# Patient Record
Sex: Female | Born: 1960 | Race: White | Hispanic: No | Marital: Married | Smoking: Never smoker
Health system: Southern US, Community
[De-identification: ages and names within clinical notes are randomized; demographics above are authoritative.]

## PROBLEM LIST (undated history)

## (undated) DIAGNOSIS — I10 Essential (primary) hypertension: Secondary | ICD-10-CM

## (undated) DIAGNOSIS — D219 Benign neoplasm of connective and other soft tissue, unspecified: Secondary | ICD-10-CM

## (undated) DIAGNOSIS — N809 Endometriosis, unspecified: Secondary | ICD-10-CM

## (undated) DIAGNOSIS — E119 Type 2 diabetes mellitus without complications: Secondary | ICD-10-CM

## (undated) HISTORY — DX: Benign neoplasm of connective and other soft tissue, unspecified: D21.9

## (undated) HISTORY — DX: Essential (primary) hypertension: I10

## (undated) HISTORY — PX: ABDOMINAL HYSTERECTOMY: SHX81

## (undated) HISTORY — PX: BREAST BIOPSY: SHX20

## (undated) HISTORY — DX: Endometriosis, unspecified: N80.9

## (undated) HISTORY — DX: Type 2 diabetes mellitus without complications: E11.9

---

## 1986-04-29 HISTORY — PX: LAPAROSCOPIC ABDOMINAL EXPLORATION: SHX6249

## 1998-03-22 ENCOUNTER — Other Ambulatory Visit: Admission: RE | Admit: 1998-03-22 | Discharge: 1998-03-22 | Payer: Self-pay | Admitting: *Deleted

## 1999-04-04 ENCOUNTER — Other Ambulatory Visit: Admission: RE | Admit: 1999-04-04 | Discharge: 1999-04-04 | Payer: Self-pay | Admitting: *Deleted

## 2000-10-15 ENCOUNTER — Other Ambulatory Visit: Admission: RE | Admit: 2000-10-15 | Discharge: 2000-10-15 | Payer: Self-pay | Admitting: *Deleted

## 2002-02-11 ENCOUNTER — Other Ambulatory Visit: Admission: RE | Admit: 2002-02-11 | Discharge: 2002-02-11 | Payer: Self-pay | Admitting: *Deleted

## 2003-04-30 HISTORY — PX: TOTAL ABDOMINAL HYSTERECTOMY W/ BILATERAL SALPINGOOPHORECTOMY: SHX83

## 2003-10-11 ENCOUNTER — Inpatient Hospital Stay (HOSPITAL_COMMUNITY): Admission: RE | Admit: 2003-10-11 | Discharge: 2003-10-13 | Payer: Self-pay | Admitting: Obstetrics and Gynecology

## 2004-04-10 ENCOUNTER — Ambulatory Visit: Admission: RE | Admit: 2004-04-10 | Discharge: 2004-04-10 | Payer: Self-pay | Admitting: *Deleted

## 2005-01-22 ENCOUNTER — Ambulatory Visit: Admission: RE | Admit: 2005-01-22 | Discharge: 2005-01-22 | Payer: Self-pay | Admitting: Family Medicine

## 2005-05-10 ENCOUNTER — Other Ambulatory Visit: Admission: RE | Admit: 2005-05-10 | Discharge: 2005-05-10 | Payer: Self-pay | Admitting: Obstetrics and Gynecology

## 2005-05-27 ENCOUNTER — Ambulatory Visit (HOSPITAL_COMMUNITY): Admission: RE | Admit: 2005-05-27 | Discharge: 2005-05-27 | Payer: Self-pay | Admitting: Obstetrics and Gynecology

## 2010-09-19 ENCOUNTER — Encounter: Payer: 59 | Attending: Family Medicine | Admitting: Dietician

## 2010-09-19 DIAGNOSIS — Z713 Dietary counseling and surveillance: Secondary | ICD-10-CM | POA: Insufficient documentation

## 2010-09-19 DIAGNOSIS — E119 Type 2 diabetes mellitus without complications: Secondary | ICD-10-CM | POA: Insufficient documentation

## 2010-09-19 DIAGNOSIS — E669 Obesity, unspecified: Secondary | ICD-10-CM | POA: Insufficient documentation

## 2011-09-04 ENCOUNTER — Other Ambulatory Visit: Payer: Self-pay | Admitting: Obstetrics & Gynecology

## 2011-09-04 DIAGNOSIS — Z1231 Encounter for screening mammogram for malignant neoplasm of breast: Secondary | ICD-10-CM

## 2011-10-01 ENCOUNTER — Ambulatory Visit (HOSPITAL_COMMUNITY)
Admission: RE | Admit: 2011-10-01 | Discharge: 2011-10-01 | Disposition: A | Discharge status: Home / Self Care | Payer: 59 | Source: Ambulatory Visit | Attending: Obstetrics & Gynecology | Admitting: Obstetrics & Gynecology

## 2011-10-01 DIAGNOSIS — Z1231 Encounter for screening mammogram for malignant neoplasm of breast: Secondary | ICD-10-CM | POA: Insufficient documentation

## 2011-10-09 ENCOUNTER — Other Ambulatory Visit: Payer: Self-pay | Admitting: Obstetrics & Gynecology

## 2011-10-09 DIAGNOSIS — R928 Other abnormal and inconclusive findings on diagnostic imaging of breast: Secondary | ICD-10-CM

## 2011-10-18 ENCOUNTER — Ambulatory Visit
Admission: RE | Admit: 2011-10-18 | Discharge: 2011-10-18 | Disposition: A | Discharge status: Home / Self Care | Payer: 59 | Source: Ambulatory Visit | Attending: Obstetrics & Gynecology | Admitting: Obstetrics & Gynecology

## 2011-10-18 ENCOUNTER — Other Ambulatory Visit: Payer: Self-pay | Admitting: Obstetrics & Gynecology

## 2011-10-18 DIAGNOSIS — R928 Other abnormal and inconclusive findings on diagnostic imaging of breast: Secondary | ICD-10-CM

## 2012-03-16 ENCOUNTER — Other Ambulatory Visit: Payer: Self-pay | Admitting: Obstetrics & Gynecology

## 2012-03-16 DIAGNOSIS — N63 Unspecified lump in unspecified breast: Secondary | ICD-10-CM

## 2012-03-16 DIAGNOSIS — N6489 Other specified disorders of breast: Secondary | ICD-10-CM

## 2012-04-09 ENCOUNTER — Ambulatory Visit
Admission: RE | Admit: 2012-04-09 | Discharge: 2012-04-09 | Disposition: A | Discharge status: Home / Self Care | Payer: BC Managed Care – PPO | Source: Ambulatory Visit | Attending: Obstetrics & Gynecology | Admitting: Obstetrics & Gynecology

## 2012-04-09 DIAGNOSIS — N63 Unspecified lump in unspecified breast: Secondary | ICD-10-CM

## 2012-04-09 DIAGNOSIS — N6489 Other specified disorders of breast: Secondary | ICD-10-CM

## 2012-09-23 ENCOUNTER — Encounter: Payer: Self-pay | Admitting: Obstetrics & Gynecology

## 2012-09-24 ENCOUNTER — Encounter: Payer: Self-pay | Admitting: Obstetrics & Gynecology

## 2012-09-24 ENCOUNTER — Ambulatory Visit (INDEPENDENT_AMBULATORY_CARE_PROVIDER_SITE_OTHER): Payer: BC Managed Care – PPO | Admitting: Obstetrics & Gynecology

## 2012-09-24 VITALS — BP 116/80 | Ht 63.0 in | Wt 209.2 lb

## 2012-09-24 DIAGNOSIS — Z23 Encounter for immunization: Secondary | ICD-10-CM

## 2012-09-24 DIAGNOSIS — Z01419 Encounter for gynecological examination (general) (routine) without abnormal findings: Secondary | ICD-10-CM

## 2012-09-24 DIAGNOSIS — Z1211 Encounter for screening for malignant neoplasm of colon: Secondary | ICD-10-CM

## 2012-09-24 DIAGNOSIS — Z0000 Encounter for general adult medical examination without abnormal findings: Secondary | ICD-10-CM

## 2012-09-24 LAB — POCT URINALYSIS DIPSTICK: Nitrite, UA: NEGATIVE

## 2012-09-24 MED ORDER — CLOBETASOL PROPIONATE 0.05 % EX CREA: TOPICAL_CREAM | Freq: Two times a day (BID) | CUTANEOUS | Status: AC

## 2012-09-24 NOTE — Progress Notes (Signed)
Tdap given and patient tolerated well.  

## 2012-09-24 NOTE — Progress Notes (Signed)
52 y.o. [id number] MarriedCaucasianF here for annual exam.  No vaginal bleeding.  She did have blood work in 08/02/2022.  CBC showed mildly elevated WBC ct but she had a cold while she was there.  Was told to repeat in 3 months.  She didn't go back and do it.  Cholesterol and HbA1C was okay.  She doesn't know any actual numbers.    Lots of stress.  Mother in law, in [place], died in 08-02-22.  She and spouse did a lot of the care.  Father in law is an alcoholic.  He is now in a nursing home.    Pt DECLINES a colonoscopy.  Will do stool test like last year.    No LMP recorded. Patient has had a hysterectomy.          Sexually active: yes  The current method of family planning is status post hysterectomy.    Exercising: yes  walking Smoker:  no  Health Maintenance: Pap:  ? 2008 History of abnormal Pap:  no MMG:  10/07/11 MMG, 10/18/11 additional views, 04/09/12 6 month f/u-diag MMG in 6 months Colonoscopy:  None.  DECLINES. BMD:   none TDaP:  Unsure.  Will do today. Screening Labs: 3/14 with Dr. Doristine Locks at [facility] in [place], [place] today: 13.7, Urine today: negative   reports that she has never smoked. She has never used smokeless tobacco. She reports that she does not drink alcohol or use illicit drugs.  Past Medical History  Diagnosis Date  . Diabetes mellitus without complication   . Hypertension     borderline  . Endometriosis     hysterectomy 2005  . Fibroid     hysterectomy in 2005    Past Surgical History  Procedure Laterality Date  . Total abdominal hysterectomy w/ bilateral salpingoophorectomy  2005    endometriosis, fibroids  . Laparoscopic abdominal exploration  1988    endometriosis  . Abdominal hysterectomy      Current Outpatient Prescriptions  Medication Sig Dispense Refill  . lisinopril (PRINIVIL,ZESTRIL) 10 MG tablet Take 10 mg by mouth daily.      . metFORMIN (GLUCOPHAGE) 1000 MG tablet Take 1,000 mg by mouth 2 (two) times daily with a meal.       No current  facility-administered medications for this visit.    Family History  Problem Relation Age of Onset  . Congestive Heart Failure Mother   . Diabetes Mother   . Hypertension Mother   . Thyroid disease Mother   . Thyroid disease Sister   . Brain cancer Father     ROS:  Pertinent items are noted in HPI.  Otherwise, a comprehensive ROS was negative.  Exam:   BP 116/80  Ht 5\' 3"  (1.6 m)  Wt 209 lb 3.2 oz (94.892 kg)  BMI 37.07 kg/m2  Weight change: +10lbs Height:   Height: 5\' 3"  (160 cm)  Ht Readings from Last 3 Encounters:  09/24/12 5\' 3"  (1.6 m)    General appearance: alert, cooperative and appears stated age Head: Normocephalic, without obvious abnormality, atraumatic Neck: no adenopathy, supple, symmetrical, trachea midline and thyroid normal to inspection and palpation Lungs: clear to auscultation bilaterally Breasts: normal appearance, no masses or tenderness, pendulous Heart: regular rate and rhythm Abdomen: soft, non-tender; bowel sounds normal; no masses,  no organomegaly Extremities: extremities normal, atraumatic, no cyanosis or edema Skin: Skin color, texture, turgor normal. No rashes or lesions Lymph nodes: Cervical, supraclavicular, and axillary nodes normal. No abnormal inguinal nodes palpated Neurologic: Grossly  normal   Pelvic: External genitalia:  no lesions              Urethra:  normal appearing urethra with no masses, tenderness or lesions              Bartholins and Skenes: normal                 Vagina: normal appearing vagina with normal color and discharge, no lesions              Cervix: absent              Pap taken: no Bimanual Exam:  Uterus:  uterus absent              Adnexa: no mass, fullness, tenderness               Rectovaginal: Confirms               Anus:  normal sphincter tone, no lesions  A:  Well Woman with normal exam H/O TAH/BSO DM  P:   Mammogram yearly.  On recall for follow-up.  Has appt in June. No pap smear needed. TDap  today. Declines colonoscopy.  OC light given. Labs with PCP. return annually or prn  An After Visit Summary was printed and given to the patient.

## 2012-09-24 NOTE — Patient Instructions (Signed)

## 2012-09-25 ENCOUNTER — Telehealth: Payer: Self-pay | Admitting: *Deleted

## 2012-09-25 NOTE — Telephone Encounter (Signed)
Message copied by Alisa Graff on Fri Sep 25, 2012 11:16 AM ------      Message from: Jerene Bears      Created: Thu Sep 24, 2012 10:52 PM       This is pt who should have gotten oc light today.  Will need to call her and mail it to her.  She lives in [place].  Maybe she will be coming back to [place] soon.  Please call and find out. ------

## 2012-09-25 NOTE — Telephone Encounter (Signed)
LMTCB

## 2012-10-20 ENCOUNTER — Other Ambulatory Visit: Payer: Self-pay | Admitting: Obstetrics & Gynecology

## 2012-10-20 DIAGNOSIS — N63 Unspecified lump in unspecified breast: Secondary | ICD-10-CM

## 2012-10-20 DIAGNOSIS — N6489 Other specified disorders of breast: Secondary | ICD-10-CM

## 2012-11-02 ENCOUNTER — Ambulatory Visit
Admission: RE | Admit: 2012-11-02 | Discharge: 2012-11-02 | Disposition: A | Discharge status: Home / Self Care | Payer: BC Managed Care – PPO | Source: Ambulatory Visit | Attending: Obstetrics & Gynecology | Admitting: Obstetrics & Gynecology

## 2012-11-02 DIAGNOSIS — N63 Unspecified lump in unspecified breast: Secondary | ICD-10-CM

## 2012-11-02 DIAGNOSIS — N6489 Other specified disorders of breast: Secondary | ICD-10-CM

## 2012-11-04 NOTE — Telephone Encounter (Signed)
Kit was mailed to patient.

## 2012-12-30 NOTE — Addendum Note (Signed)
Addended by: Alphonsa Overall on: 12/30/2012 10:51 AM   Modules accepted: Orders

## 2012-12-31 ENCOUNTER — Telehealth: Payer: Self-pay

## 2012-12-31 NOTE — Telephone Encounter (Signed)
9/4 lmtcb

## 2012-12-31 NOTE — Telephone Encounter (Signed)
Message copied by Elisha Headland on Thu Dec 31, 2012  9:07 AM ------      Message from: Jerene Bears      Created: Thu Dec 31, 2012  2:57 AM       Is pt aware? If no, please inform. ------

## 2013-01-12 ENCOUNTER — Telehealth: Payer: Self-pay

## 2013-01-15 NOTE — Telephone Encounter (Signed)
Patient notified of IFOB results.

## 2013-01-15 NOTE — Telephone Encounter (Signed)
Patient aware of IFOB results.

## 2013-05-26 ENCOUNTER — Encounter: Payer: Self-pay | Admitting: Obstetrics & Gynecology

## 2013-10-15 ENCOUNTER — Ambulatory Visit: Payer: BC Managed Care – PPO | Admitting: Obstetrics & Gynecology

## 2013-10-18 ENCOUNTER — Encounter: Payer: Self-pay | Admitting: Obstetrics & Gynecology

## 2013-10-18 ENCOUNTER — Other Ambulatory Visit: Payer: Self-pay | Admitting: Obstetrics & Gynecology

## 2013-10-18 ENCOUNTER — Telehealth: Payer: Self-pay | Admitting: Obstetrics & Gynecology

## 2013-10-18 ENCOUNTER — Ambulatory Visit (INDEPENDENT_AMBULATORY_CARE_PROVIDER_SITE_OTHER): Payer: BC Managed Care – PPO | Admitting: Obstetrics & Gynecology

## 2013-10-18 VITALS — BP 142/80 | HR 64 | Resp 20 | Ht 62.5 in | Wt 234.8 lb

## 2013-10-18 DIAGNOSIS — E119 Type 2 diabetes mellitus without complications: Secondary | ICD-10-CM

## 2013-10-18 DIAGNOSIS — Z0000 Encounter for general adult medical examination without abnormal findings: Secondary | ICD-10-CM

## 2013-10-18 DIAGNOSIS — Z1211 Encounter for screening for malignant neoplasm of colon: Secondary | ICD-10-CM

## 2013-10-18 DIAGNOSIS — Z01419 Encounter for gynecological examination (general) (routine) without abnormal findings: Secondary | ICD-10-CM

## 2013-10-18 DIAGNOSIS — N6489 Other specified disorders of breast: Secondary | ICD-10-CM

## 2013-10-18 LAB — COMPREHENSIVE METABOLIC PANEL
ALK PHOS: 90 U/L (ref 39–117)
ALT: 14 U/L (ref 0–35)
AST: 14 U/L (ref 0–37)
Albumin: 4.5 g/dL (ref 3.5–5.2)
BUN: 12 mg/dL (ref 6–23)
CO2: 26 meq/L (ref 19–32)
Calcium: 9.5 mg/dL (ref 8.4–10.5)
Chloride: 102 mEq/L (ref 96–112)
Creat: 0.71 mg/dL (ref 0.50–1.10)
Glucose, Bld: 99 mg/dL (ref 70–99)
Potassium: 3.9 mEq/L (ref 3.5–5.3)
SODIUM: 138 meq/L (ref 135–145)
Total Bilirubin: 0.5 mg/dL (ref 0.2–1.2)
Total Protein: 7.3 g/dL (ref 6.0–8.3)

## 2013-10-18 LAB — CBC
HCT: 38.1 % (ref 36.0–46.0)
Hemoglobin: 12.8 g/dL (ref 12.0–15.0)
MCH: 26.3 pg (ref 26.0–34.0)
MCHC: 33.6 g/dL (ref 30.0–36.0)
MCV: 78.4 fL (ref 78.0–100.0)
Platelets: 337 10*3/uL (ref 150–400)
RBC: 4.86 MIL/uL (ref 3.87–5.11)
RDW: 15.2 % (ref 11.5–15.5)
WBC: 7.5 10*3/uL (ref 4.0–10.5)

## 2013-10-18 LAB — LIPID PANEL
CHOL/HDL RATIO: 5 ratio
CHOLESTEROL: 210 mg/dL — AB (ref 0–200)
HDL: 42 mg/dL (ref >39)
LDL Cholesterol: 130 mg/dL — ABNORMAL HIGH (ref 0–99)
TRIGLYCERIDES: 191 mg/dL — AB (ref <150)
VLDL: 38 mg/dL (ref 0–40)

## 2013-10-18 LAB — HEMOGLOBIN A1C
Hgb A1c MFr Bld: 7.7 % — ABNORMAL HIGH (ref <5.7)
MEAN PLASMA GLUCOSE: 174 mg/dL — AB (ref <117)

## 2013-10-18 LAB — POCT URINALYSIS DIPSTICK
BILIRUBIN UA: NEGATIVE
Glucose, UA: NEGATIVE
Ketones, UA: NEGATIVE
Leukocytes, UA: NEGATIVE
Nitrite, UA: NEGATIVE
PH UA: 5
PROTEIN UA: NEGATIVE
RBC UA: NEGATIVE
UROBILINOGEN UA: NEGATIVE

## 2013-10-18 LAB — TSH: TSH: 1.939 u[IU]/mL (ref 0.350–4.500)

## 2013-10-18 NOTE — Telephone Encounter (Signed)
Voicemail confirmed mobile #. Left message stating that mammogram is scheduled 07.08.2015 @ 0930 at the [facility]. Advised patient to arrive at 0915.

## 2013-10-18 NOTE — Progress Notes (Signed)
53 y.o. [id number] MarriedCaucasianF here for annual exam.  Doing well.  No vaginal bleeding.  Knows she needs to schedule MMG.  Will be doing a diagnostic.  She would like [place] to schedule.  Reports she has stopped all of her medicine--the metformin and the lisonopril.  Brother in law with bladder cancer that was "caused" by metformin.  He is doing OK but pt doesn't really want to be on any medications if possible.    Patient's last menstrual period was 04/30/2003.          Sexually active: yes  The current method of family planning is status post hysterectomy.    Exercising: yes  walking daily Smoker:  no  Health Maintenance: Pap:  ?2008 History of abnormal Pap:  no MMG:  11/02/12-bilateral diag one year Colonoscopy:  none BMD:   none TDaP:  09/24/12 Screening Labs: today, Hb today: today, Urine today: negative   reports that she has never smoked. She has never used smokeless tobacco. She reports that she does not drink alcohol or use illicit drugs.  Past Medical History  Diagnosis Date  . Diabetes mellitus without complication   . Hypertension     borderline  . Endometriosis     hysterectomy 2005  . Fibroid     hysterectomy in 2005    Past Surgical History  Procedure Laterality Date  . Total abdominal hysterectomy w/ bilateral salpingoophorectomy  2005    endometriosis, fibroids  . Laparoscopic abdominal exploration  1988    endometriosis  . Abdominal hysterectomy      Current Outpatient Prescriptions  Medication Sig Dispense Refill  . clobetasol cream (TEMOVATE) 0.05 % Apply topically 2 (two) times daily.  60 g  1  . lisinopril (PRINIVIL,ZESTRIL) 10 MG tablet Take 10 mg by mouth daily.      . metFORMIN (GLUCOPHAGE) 1000 MG tablet Take 1,000 mg by mouth 2 (two) times daily with a meal.       No current facility-administered medications for this visit.    Family History  Problem Relation Age of Onset  . Congestive Heart Failure Mother   . Diabetes Mother   . Hypertension  Mother   . Thyroid disease Mother   . Thyroid disease Sister   . Brain cancer Father     ROS:  Pertinent items are noted in HPI.  Otherwise, a comprehensive ROS was negative.  Exam:   BP 142/80  Pulse 64  Resp 20  Ht 5' 2.5" (1.588 m)  Wt 234 lb 12.8 oz (106.505 kg)  BMI 42.23 kg/m2  LMP 04/30/2003  Weight change: +16#  Height: 5' 2.5" (158.8 cm)  Ht Readings from Last 3 Encounters:  10/18/13 5' 2.5" (1.588 m)  09/24/12 5\' 3"  (1.6 m)    General appearance: alert, cooperative and appears stated age Head: Normocephalic, without obvious abnormality, atraumatic Neck: no adenopathy, supple, symmetrical, trachea midline and thyroid normal to inspection and palpation Lungs: clear to auscultation bilaterally Breasts: normal appearance, no masses or tenderness Heart: regular rate and rhythm Abdomen: soft, non-tender; bowel sounds normal; no masses,  no organomegaly Extremities: extremities normal, atraumatic, no cyanosis or edema Skin: Skin color, texture, turgor normal. No rashes or lesions Lymph nodes: Cervical, supraclavicular, and axillary nodes normal. No abnormal inguinal nodes palpated Neurologic: Grossly normal   Pelvic: External genitalia:  no lesions              Urethra:  normal appearing urethra with no masses, tenderness or lesions  Bartholins and Skenes: normal                 Vagina: normal appearing vagina with normal color and discharge, no lesions              Cervix: absent              Pap taken: no Bimanual Exam:  Uterus:  uterus absent              Adnexa: normal adnexa and no mass, fullness, tenderness               Rectovaginal: Confirms               Anus:  normal sphincter tone, no lesions  A:  Well Woman with normal exam  H/O TAH/BSO  DM.  Stopped medications and hasn't follow up with PCP.  P: Mammogram yearly. Follow due in July.  Hasn't scheduled.   No pap smear needed.  Declines colonoscopy. OC light given.  CBC, CMP, Lipids, TSH,  and Vit D, hb A1c. Labs with PCP.  An After Visit Summary was printed and given to the patient.

## 2013-10-19 LAB — VITAMIN D 25 HYDROXY (VIT D DEFICIENCY, FRACTURES): VIT D 25 HYDROXY: 52 ng/mL (ref 30–89)

## 2013-10-19 LAB — HEMOGLOBIN, FINGERSTICK: HEMOGLOBIN, FINGERSTICK: 12.6 g/dL (ref 12.0–16.0)

## 2013-10-22 ENCOUNTER — Telehealth: Payer: Self-pay

## 2013-10-22 NOTE — Telephone Encounter (Signed)
Message copied by Robley Fries on Fri Oct 22, 2013  9:48 AM ------      Message from: Megan Salon      Created: Wed Oct 20, 2013 10:12 AM       Please inform pt CMP is normal.  TSH is normal.  CBC is normal.  Vit D is normal.  Hb A1C is 7.7.  Her diabetes is NOT under control.  Also, her cholesterol is elevated--TGs are elevated, LDLs are elevated, HDLs are low.  Ratio is high.  I highly recommend she consider treatment for both.  Would she like to go back to her PCP? ------

## 2013-10-22 NOTE — Telephone Encounter (Signed)
Lmtcb//kn 

## 2013-10-25 NOTE — Telephone Encounter (Signed)
Pt returning call

## 2013-10-26 NOTE — Telephone Encounter (Signed)
Patient notified of all results. See phone note//kn

## 2013-11-03 ENCOUNTER — Ambulatory Visit
Admission: RE | Admit: 2013-11-03 | Discharge: 2013-11-03 | Disposition: A | Discharge status: Home / Self Care | Payer: BC Managed Care – PPO | Source: Ambulatory Visit | Attending: Obstetrics & Gynecology | Admitting: Obstetrics & Gynecology

## 2013-11-03 DIAGNOSIS — N6489 Other specified disorders of breast: Secondary | ICD-10-CM

## 2013-11-03 LAB — FECAL OCCULT BLOOD, IMMUNOCHEMICAL: IFOBT: NEGATIVE

## 2014-02-28 ENCOUNTER — Encounter: Payer: Self-pay | Admitting: Obstetrics & Gynecology

## 2014-09-27 ENCOUNTER — Other Ambulatory Visit: Payer: Self-pay

## 2014-09-27 DIAGNOSIS — Z1231 Encounter for screening mammogram for malignant neoplasm of breast: Secondary | ICD-10-CM

## 2014-11-11 ENCOUNTER — Ambulatory Visit (INDEPENDENT_AMBULATORY_CARE_PROVIDER_SITE_OTHER): Payer: BLUE CROSS/BLUE SHIELD | Admitting: Obstetrics & Gynecology

## 2014-11-11 ENCOUNTER — Encounter: Payer: Self-pay | Admitting: Obstetrics & Gynecology

## 2014-11-11 VITALS — BP 122/68 | HR 64 | Resp 20 | Ht 63.0 in | Wt 221.0 lb

## 2014-11-11 DIAGNOSIS — Z01419 Encounter for gynecological examination (general) (routine) without abnormal findings: Secondary | ICD-10-CM

## 2014-11-11 DIAGNOSIS — I10 Essential (primary) hypertension: Secondary | ICD-10-CM | POA: Diagnosis not present

## 2014-11-11 DIAGNOSIS — E785 Hyperlipidemia, unspecified: Secondary | ICD-10-CM

## 2014-11-11 DIAGNOSIS — Z1211 Encounter for screening for malignant neoplasm of colon: Secondary | ICD-10-CM | POA: Diagnosis not present

## 2014-11-11 DIAGNOSIS — Z0000 Encounter for general adult medical examination without abnormal findings: Secondary | ICD-10-CM

## 2014-11-11 DIAGNOSIS — E119 Type 2 diabetes mellitus without complications: Secondary | ICD-10-CM | POA: Insufficient documentation

## 2014-11-11 LAB — POCT URINALYSIS DIPSTICK
BILIRUBIN UA: NEGATIVE
Blood, UA: NEGATIVE
GLUCOSE UA: NEGATIVE
KETONES UA: NEGATIVE
Leukocytes, UA: NEGATIVE
Nitrite, UA: NEGATIVE
Protein, UA: NEGATIVE
Urobilinogen, UA: NEGATIVE
pH, UA: 7

## 2014-11-11 NOTE — Addendum Note (Signed)
Addended by: Megan Salon on: 11/11/2014 10:03 AM   Modules accepted: Miquel Dunn

## 2014-11-11 NOTE — Progress Notes (Signed)
54 y.o. [id number] MarriedCaucasianF here for annual exam.  Pt changing jobs.  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  Very glad job will be changing.  She is happy to be changing jobs.    Otherwise, doing well.   Last hba1c was 6.2.  Was 7.7 one year ago.    Denies VB.  PCP:  Dr. Tollie Pizza  Patient's last menstrual period was 04/30/2003.          Sexually active: Yes.    The current method of family planning is status post hysterectomy.    Exercising: Yes.    walking Smoker:  no  Health Maintenance: Pap:  ? 2008 History of abnormal Pap:  no MMG:  11/03/13 Diag-BiRads 1, normal-screening in one year Colonoscopy:  none BMD:   none TDaP:  09/24/12 Screening Labs: PCP, Hb today: PCP, Urine today: PH-7.0   reports that she has never smoked. She has never used smokeless tobacco. She reports that she does not drink alcohol or use illicit drugs.  Past Medical History  Diagnosis Date  . Diabetes mellitus without complication   . Hypertension     borderline  . Endometriosis     hysterectomy 2005  . Fibroid     hysterectomy in 2005    Past Surgical History  Procedure Laterality Date  . Total abdominal hysterectomy w/ bilateral salpingoophorectomy  2005    endometriosis, fibroids  . Laparoscopic abdominal exploration  1988    endometriosis  . Abdominal hysterectomy      Current Outpatient Prescriptions  Medication Sig Dispense Refill  . atorvastatin (LIPITOR) 20 MG tablet Take 20 mg by mouth every evening. 1/2 tablet 3 x weekly  4  . lisinopril (PRINIVIL,ZESTRIL) 10 MG tablet Take 10 mg by mouth daily.    . metFORMIN (GLUCOPHAGE) 1000 MG tablet Take 1,000 mg by mouth 2 (two) times daily with a meal.    . clobetasol cream (TEMOVATE) 0.05 % Apply topically 2 (two) times daily. (Patient not taking: Reported on 11/11/2014) 60 g 1   No current facility-administered medications for this visit.    Family History  Problem Relation Age of Onset  . Congestive Heart Failure  Mother   . Diabetes Mother   . Hypertension Mother   . Thyroid disease Mother   . Thyroid disease Sister   . Brain cancer Father     ROS:  Pertinent items are noted in HPI.  Otherwise, a comprehensive ROS was negative.  Exam:   BP 122/68 mmHg  Pulse 64  Resp 20  Ht 5\' 3"  (1.6 m)  Wt 221 lb (100.245 kg)  BMI 39.16 kg/m2  LMP 04/30/2003  Weight change: -13#    Height: 5\' 3"  (160 cm)  Ht Readings from Last 3 Encounters:  11/11/14 5\' 3"  (1.6 m)  10/18/13 5' 2.5" (1.588 m)  09/24/12 5\' 3"  (1.6 m)    General appearance: alert, cooperative and appears stated age Head: Normocephalic, without obvious abnormality, atraumatic Neck: no adenopathy, supple, symmetrical, trachea midline and thyroid normal to inspection and palpation Lungs: clear to auscultation bilaterally Breasts: normal appearance, no masses or tenderness Heart: regular rate and rhythm Abdomen: soft, non-tender; bowel sounds normal; no masses,  no organomegaly Extremities: extremities normal, atraumatic, no cyanosis or edema Skin: Skin color, texture, turgor normal. No rashes or lesions Lymph nodes: Cervical, supraclavicular, and axillary nodes normal. No abnormal inguinal nodes palpated Neurologic: Grossly normal   Pelvic: External genitalia:  no lesions  Urethra:  normal appearing urethra with no masses, tenderness or lesions              Bartholins and Skenes: normal                 Vagina: normal appearing vagina with normal color and discharge, no lesions              Cervix: absent              Pap taken: No. Bimanual Exam:  Uterus:  uterus absent              Adnexa: normal adnexa and no mass, fullness, tenderness               Rectovaginal: Confirms               Anus:  normal sphincter tone, no lesions  Chaperone was present for exam.  A:  Well Woman with normal exam  H/O TAH/BSO  DM. Stopped medications and hasn't follow up with PCP.  P: Mammogram yearly.  Pt has scheduled. No pap  smear needed.  Declines colonoscopy. OC light given.  Labs with Dr. Tollie Pizza.

## 2014-11-15 ENCOUNTER — Ambulatory Visit
Admission: RE | Admit: 2014-11-15 | Discharge: 2014-11-15 | Disposition: A | Discharge status: Home / Self Care | Payer: BLUE CROSS/BLUE SHIELD | Source: Ambulatory Visit

## 2014-11-15 DIAGNOSIS — Z1231 Encounter for screening mammogram for malignant neoplasm of breast: Secondary | ICD-10-CM

## 2014-12-05 LAB — FECAL OCCULT BLOOD, IMMUNOCHEMICAL: IMMUNOLOGICAL FECAL OCCULT BLOOD TEST: NEGATIVE

## 2014-12-05 NOTE — Addendum Note (Signed)
Addended by: Susanne Greenhouse E on: 12/05/2014 10:08 AM   Modules accepted: Orders

## 2014-12-06 ENCOUNTER — Telehealth: Payer: Self-pay

## 2014-12-06 NOTE — Telephone Encounter (Signed)
-----   Message from Megan Salon, MD sent at 12/05/2014 12:28 PM EDT ----- Inform stool test negative for blood.

## 2014-12-06 NOTE — Telephone Encounter (Signed)
Lmtcb//kn 

## 2014-12-15 NOTE — Telephone Encounter (Signed)
-----   Message from Megan Salon, MD sent at 12/05/2014 12:28 PM EDT ----- Inform stool test negative for blood.

## 2014-12-15 NOTE — Telephone Encounter (Signed)
Left Message To Call Back  

## 2015-01-31 NOTE — Telephone Encounter (Signed)
Lmtcb//kn 

## 2015-02-02 ENCOUNTER — Telehealth: Payer: Self-pay | Admitting: Obstetrics & Gynecology

## 2015-02-02 NOTE — Telephone Encounter (Signed)
Patient notified of all results.//kn 

## 2015-02-02 NOTE — Telephone Encounter (Signed)
Patient notified of results.//kn 

## 2015-02-02 NOTE — Telephone Encounter (Signed)
Patient returned [facility] call.

## 2015-10-30 ENCOUNTER — Other Ambulatory Visit: Payer: Self-pay | Admitting: Family Medicine

## 2015-10-30 DIAGNOSIS — Z1231 Encounter for screening mammogram for malignant neoplasm of breast: Secondary | ICD-10-CM

## 2015-11-16 ENCOUNTER — Ambulatory Visit
Admission: RE | Admit: 2015-11-16 | Discharge: 2015-11-16 | Disposition: A | Discharge status: Home / Self Care | Payer: BLUE CROSS/BLUE SHIELD | Source: Ambulatory Visit | Attending: Family Medicine | Admitting: Family Medicine

## 2015-11-16 DIAGNOSIS — Z1231 Encounter for screening mammogram for malignant neoplasm of breast: Secondary | ICD-10-CM

## 2016-01-26 ENCOUNTER — Telehealth: Payer: Self-pay | Admitting: Obstetrics & Gynecology

## 2016-01-26 NOTE — Telephone Encounter (Signed)
Left patient a message to call back to reschedule a future appointment that was cancelled by the provider. °

## 2016-02-09 ENCOUNTER — Ambulatory Visit: Payer: BLUE CROSS/BLUE SHIELD | Admitting: Obstetrics & Gynecology

## 2016-11-08 ENCOUNTER — Other Ambulatory Visit: Payer: Self-pay | Admitting: Obstetrics & Gynecology

## 2016-11-08 DIAGNOSIS — Z1231 Encounter for screening mammogram for malignant neoplasm of breast: Secondary | ICD-10-CM

## 2016-11-18 ENCOUNTER — Ambulatory Visit
Admission: RE | Admit: 2016-11-18 | Discharge: 2016-11-18 | Disposition: A | Discharge status: Home / Self Care | Payer: BLUE CROSS/BLUE SHIELD | Source: Ambulatory Visit | Attending: Obstetrics & Gynecology | Admitting: Obstetrics & Gynecology

## 2016-11-18 DIAGNOSIS — Z1231 Encounter for screening mammogram for malignant neoplasm of breast: Secondary | ICD-10-CM

## 2017-11-12 ENCOUNTER — Other Ambulatory Visit: Payer: Self-pay | Admitting: Physician Assistant

## 2017-11-12 DIAGNOSIS — Z1231 Encounter for screening mammogram for malignant neoplasm of breast: Secondary | ICD-10-CM

## 2017-12-02 ENCOUNTER — Ambulatory Visit
Admission: RE | Admit: 2017-12-02 | Discharge: 2017-12-02 | Disposition: A | Discharge status: Home / Self Care | Payer: 59 | Source: Ambulatory Visit | Attending: Physician Assistant | Admitting: Physician Assistant

## 2017-12-02 DIAGNOSIS — Z1231 Encounter for screening mammogram for malignant neoplasm of breast: Secondary | ICD-10-CM

## 2018-11-27 ENCOUNTER — Other Ambulatory Visit: Payer: Self-pay | Admitting: Physician Assistant

## 2018-11-27 DIAGNOSIS — Z1231 Encounter for screening mammogram for malignant neoplasm of breast: Secondary | ICD-10-CM

## 2019-01-11 ENCOUNTER — Ambulatory Visit
Admission: RE | Admit: 2019-01-11 | Discharge: 2019-01-11 | Disposition: A | Discharge status: Home / Self Care | Payer: 59 | Source: Ambulatory Visit | Attending: Physician Assistant | Admitting: Physician Assistant

## 2019-01-11 ENCOUNTER — Other Ambulatory Visit: Payer: Self-pay

## 2019-01-11 DIAGNOSIS — Z1231 Encounter for screening mammogram for malignant neoplasm of breast: Secondary | ICD-10-CM

## 2020-01-25 ENCOUNTER — Other Ambulatory Visit: Payer: Self-pay | Admitting: Physician Assistant

## 2020-01-25 DIAGNOSIS — Z1231 Encounter for screening mammogram for malignant neoplasm of breast: Secondary | ICD-10-CM

## 2020-02-01 ENCOUNTER — Other Ambulatory Visit: Payer: Self-pay

## 2020-02-01 ENCOUNTER — Ambulatory Visit
Admission: RE | Admit: 2020-02-01 | Discharge: 2020-02-01 | Disposition: A | Discharge status: Home / Self Care | Payer: 59 | Source: Ambulatory Visit | Attending: Physician Assistant | Admitting: Physician Assistant

## 2020-02-01 DIAGNOSIS — Z1231 Encounter for screening mammogram for malignant neoplasm of breast: Secondary | ICD-10-CM

## 2021-02-14 ENCOUNTER — Other Ambulatory Visit: Payer: Self-pay | Admitting: Physician Assistant

## 2021-02-14 DIAGNOSIS — Z1231 Encounter for screening mammogram for malignant neoplasm of breast: Secondary | ICD-10-CM

## 2021-03-14 ENCOUNTER — Ambulatory Visit
Admission: RE | Admit: 2021-03-14 | Discharge: 2021-03-14 | Disposition: A | Discharge status: Home / Self Care | Payer: 59 | Source: Ambulatory Visit | Attending: Physician Assistant | Admitting: Physician Assistant

## 2021-03-14 ENCOUNTER — Other Ambulatory Visit: Payer: Self-pay

## 2021-03-14 DIAGNOSIS — Z1231 Encounter for screening mammogram for malignant neoplasm of breast: Secondary | ICD-10-CM

## 2022-03-14 ENCOUNTER — Other Ambulatory Visit: Payer: Self-pay

## 2022-03-14 DIAGNOSIS — Z1231 Encounter for screening mammogram for malignant neoplasm of breast: Secondary | ICD-10-CM

## 2022-05-09 ENCOUNTER — Ambulatory Visit
Admission: RE | Admit: 2022-05-09 | Discharge: 2022-05-09 | Disposition: A | Discharge status: Home / Self Care | Payer: 59 | Source: Ambulatory Visit | Attending: Physician Assistant | Admitting: Physician Assistant

## 2022-05-09 DIAGNOSIS — Z1231 Encounter for screening mammogram for malignant neoplasm of breast: Secondary | ICD-10-CM

## 2022-12-29 IMAGING — MG MM DIGITAL SCREENING BILAT W/ TOMO AND CAD
8 of 14 series · 8 of 40 positions shown · non-contrast
Comparison: Previous exam(s).

CLINICAL DATA: Screening.

EXAM:
DIGITAL SCREENING BILATERAL MAMMOGRAM WITH TOMOSYNTHESIS AND CAD
TECHNIQUE: Bilateral screening digital craniocaudal and mediolateral oblique
mammograms were obtained. Bilateral screening digital breast
tomosynthesis was performed. The images were evaluated with
computer-aided detection.

[L MLO synth-2D (1 of 2)]
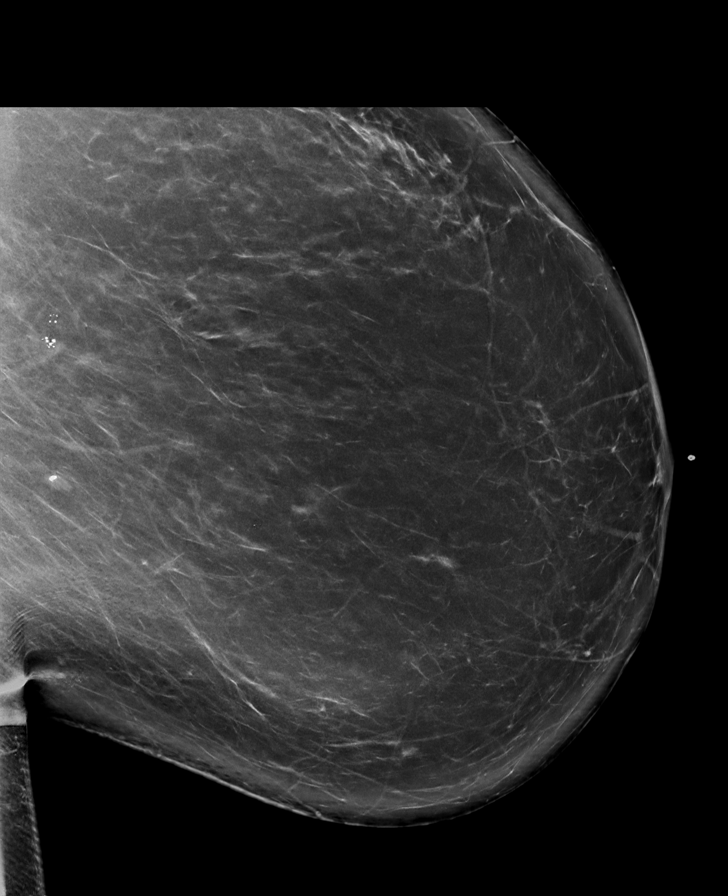

[L MLO synth-2D (2 of 2)]
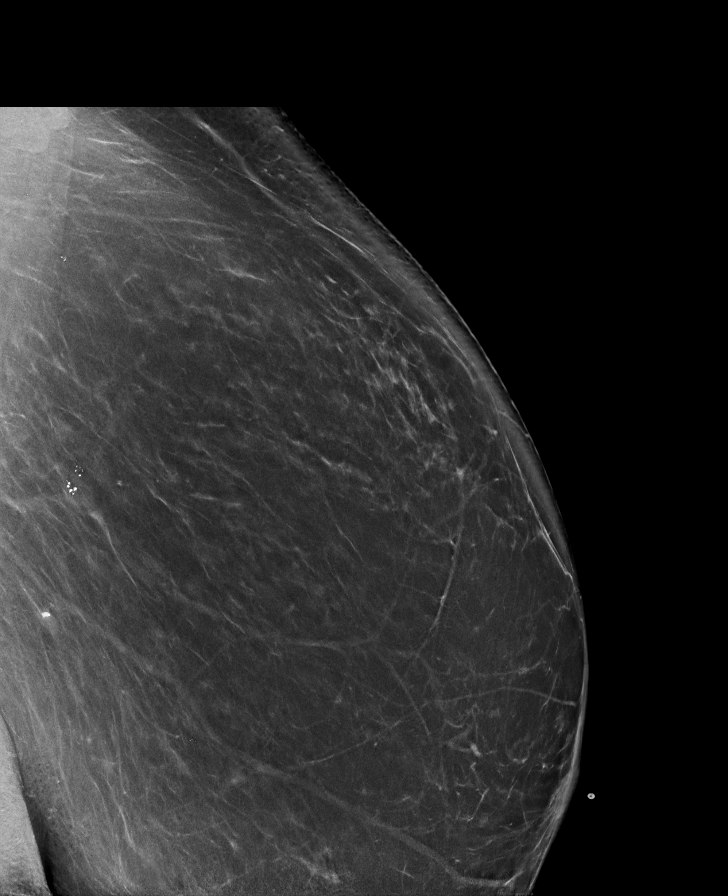

[R XCCL synth-2D]
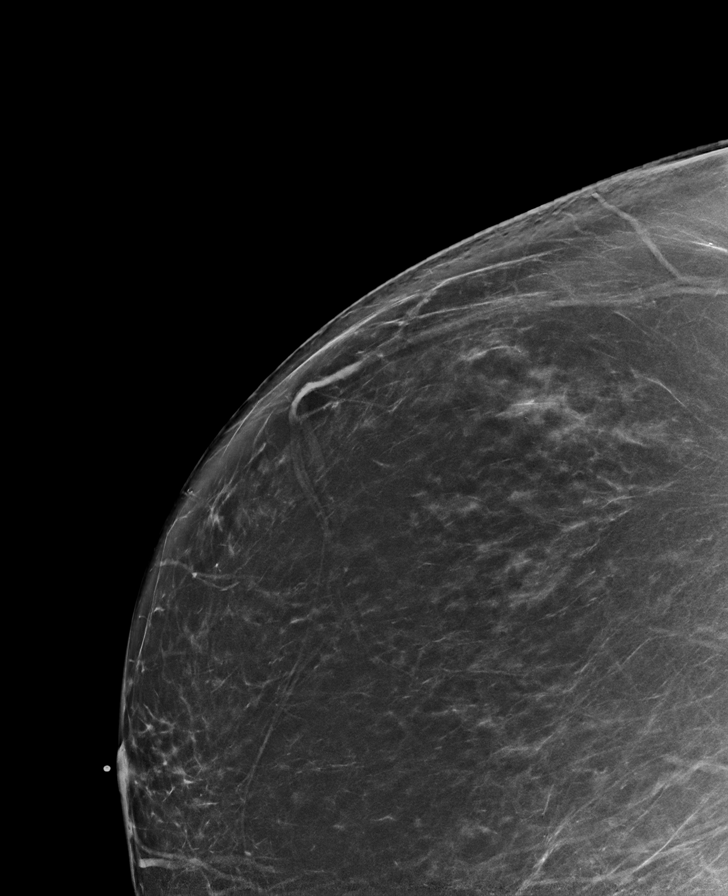

[L XCCL synth-2D]
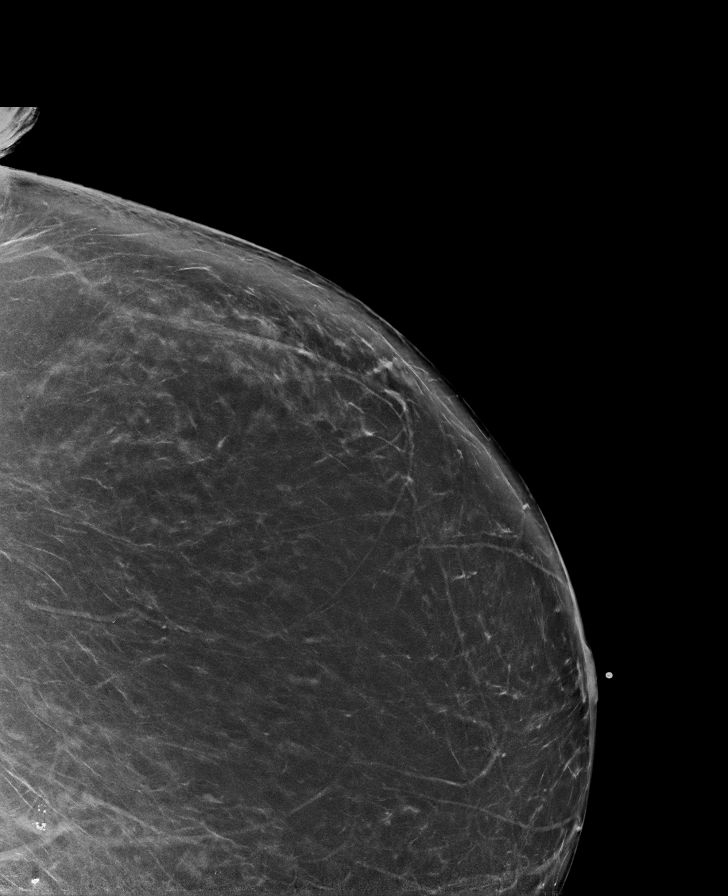

[R MLO synth-2D (1 of 2)]
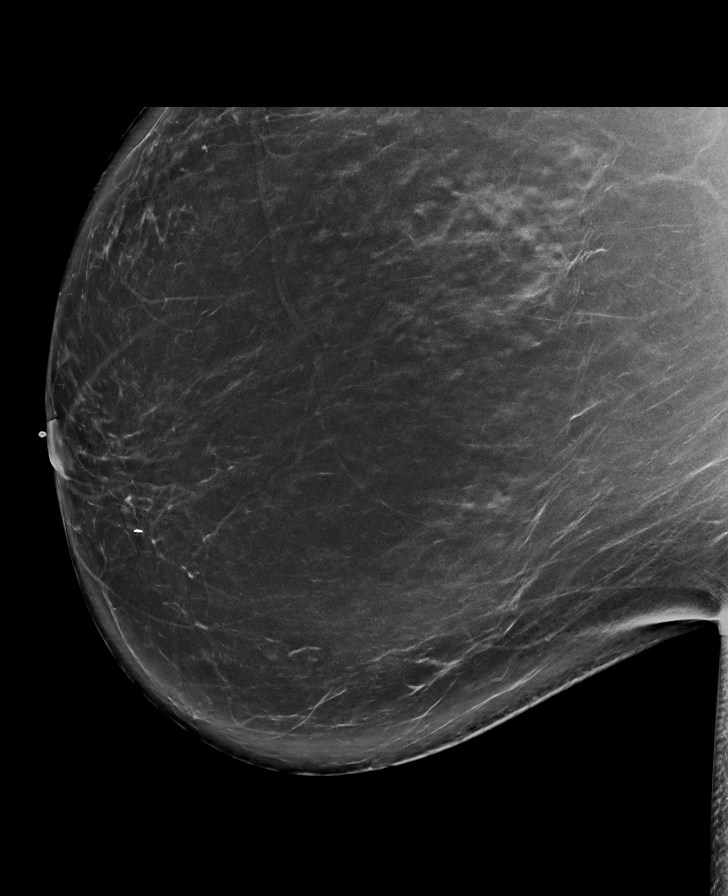

[L CC synth-2D]
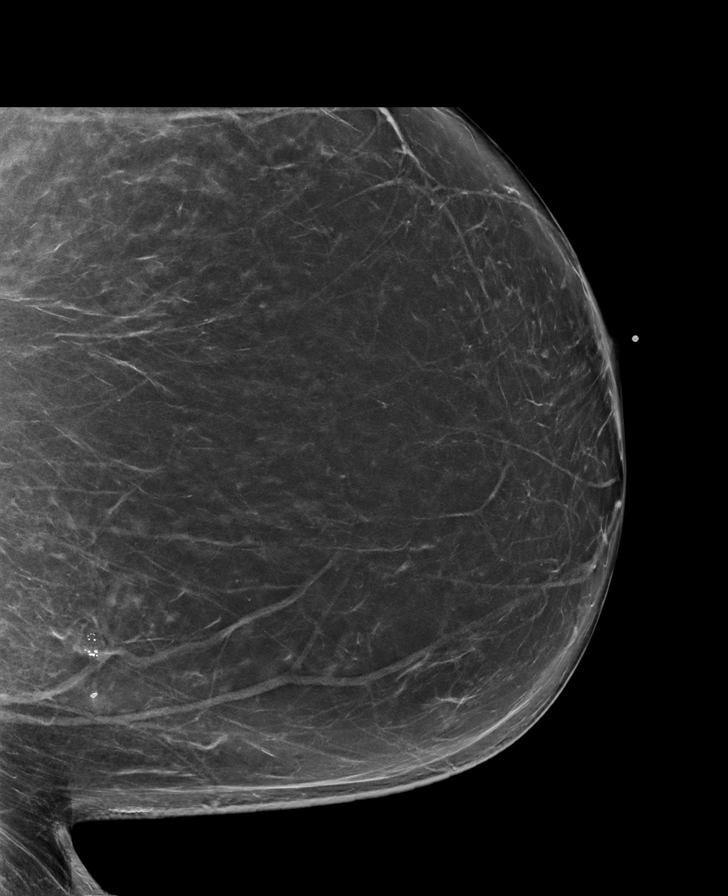

[R MLO synth-2D (2 of 2)]
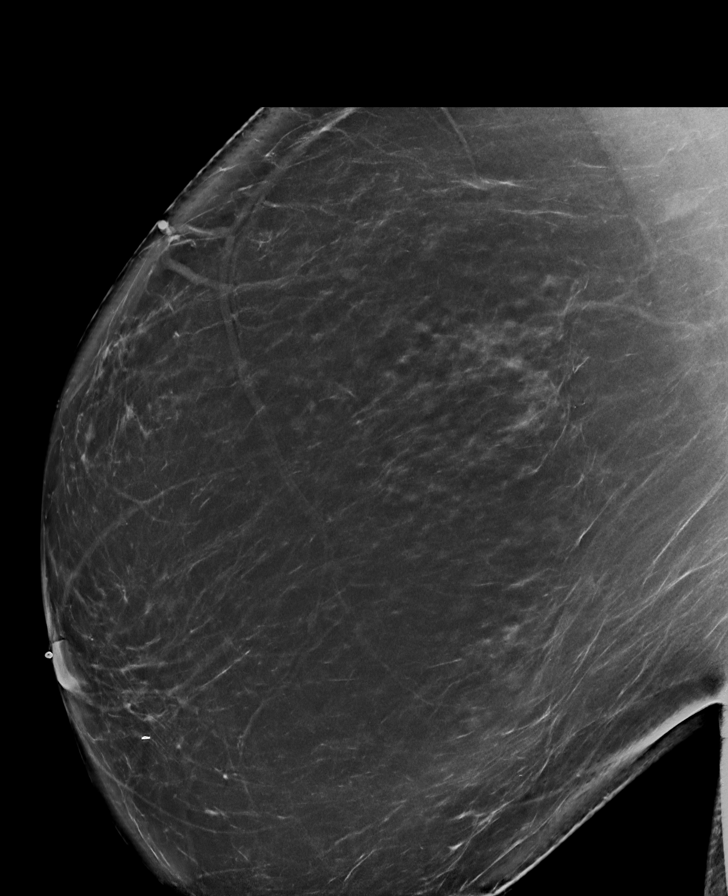

[R XCCL tomo · tomo slice 49/98.0]
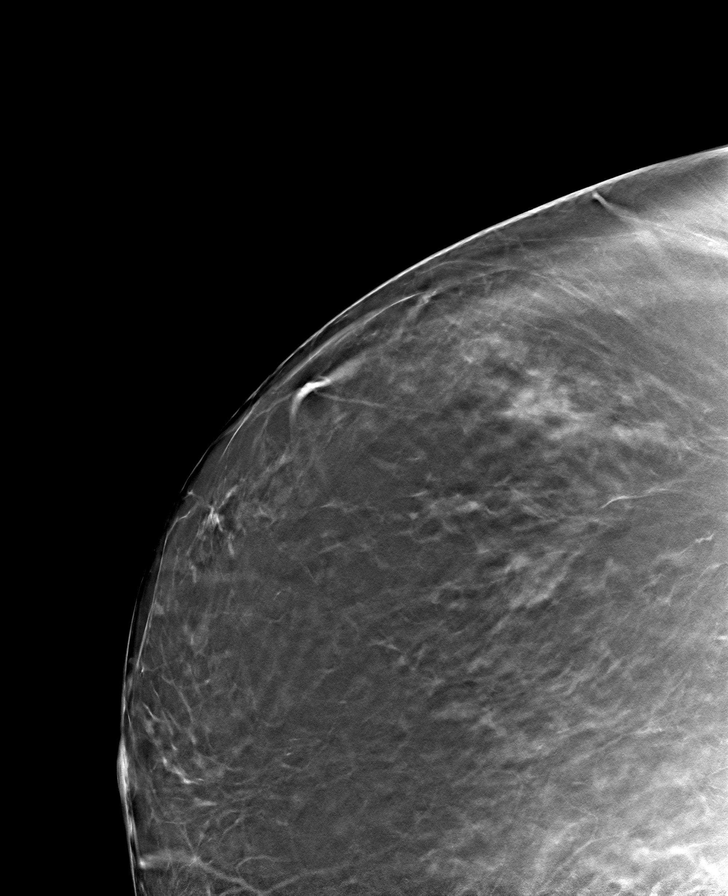

[8 of 40 positions shown; findings below may reference images not displayed]

ACR Breast Density Category b: There are scattered areas of
fibroglandular density.
FINDINGS: There are no findings suspicious for malignancy.
IMPRESSION: No mammographic evidence of malignancy. A result letter of this
screening mammogram will be mailed directly to the patient.

RECOMMENDATION:
Screening mammogram in one year. (Code:51-O-LD2)

BI-RADS CATEGORY  1: Negative.

## 2023-05-08 ENCOUNTER — Other Ambulatory Visit: Payer: Self-pay | Admitting: Family Medicine

## 2023-05-08 DIAGNOSIS — Z1231 Encounter for screening mammogram for malignant neoplasm of breast: Secondary | ICD-10-CM

## 2023-05-16 ENCOUNTER — Ambulatory Visit
Admission: RE | Admit: 2023-05-16 | Discharge: 2023-05-16 | Disposition: A | Discharge status: Home / Self Care | Payer: 59 | Source: Ambulatory Visit | Attending: Family Medicine | Admitting: Family Medicine

## 2023-05-16 DIAGNOSIS — Z1231 Encounter for screening mammogram for malignant neoplasm of breast: Secondary | ICD-10-CM
